# Patient Record
Sex: Male | Born: 1960 | Hispanic: No | Marital: Married | Smoking: Never smoker
Health system: Southern US, Community
[De-identification: ages and names within clinical notes are randomized; demographics above are authoritative.]

## PROBLEM LIST (undated history)

## (undated) DIAGNOSIS — S060XAA Concussion with loss of consciousness status unknown, initial encounter: Secondary | ICD-10-CM

## (undated) HISTORY — DX: Concussion with loss of consciousness status unknown, initial encounter: S06.0XAA

---

## 2015-10-26 LAB — HM COLONOSCOPY

## 2020-11-21 ENCOUNTER — Other Ambulatory Visit (HOSPITAL_BASED_OUTPATIENT_CLINIC_OR_DEPARTMENT_OTHER): Payer: Self-pay | Admitting: Family Medicine

## 2020-11-21 DIAGNOSIS — S060X0A Concussion without loss of consciousness, initial encounter: Secondary | ICD-10-CM

## 2020-11-29 ENCOUNTER — Ambulatory Visit (HOSPITAL_BASED_OUTPATIENT_CLINIC_OR_DEPARTMENT_OTHER): Payer: 59

## 2020-12-07 ENCOUNTER — Other Ambulatory Visit: Payer: Self-pay

## 2020-12-07 ENCOUNTER — Ambulatory Visit: Payer: 59 | Attending: Family Medicine

## 2020-12-07 DIAGNOSIS — R42 Dizziness and giddiness: Secondary | ICD-10-CM

## 2020-12-07 DIAGNOSIS — M542 Cervicalgia: Secondary | ICD-10-CM | POA: Insufficient documentation

## 2020-12-07 DIAGNOSIS — R2681 Unsteadiness on feet: Secondary | ICD-10-CM | POA: Diagnosis present
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
## 2020-12-12 ENCOUNTER — Other Ambulatory Visit: Payer: Self-pay

## 2020-12-12 ENCOUNTER — Ambulatory Visit (HOSPITAL_BASED_OUTPATIENT_CLINIC_OR_DEPARTMENT_OTHER)
Admission: RE | Admit: 2020-12-12 | Discharge: 2020-12-12 | Disposition: A | Discharge status: Home / Self Care | Payer: 59 | Source: Ambulatory Visit | Attending: Family Medicine | Admitting: Family Medicine

## 2020-12-12 DIAGNOSIS — S060X0D Concussion without loss of consciousness, subsequent encounter: Secondary | ICD-10-CM | POA: Diagnosis not present

## 2020-12-12 DIAGNOSIS — S060X0A Concussion without loss of consciousness, initial encounter: Secondary | ICD-10-CM

## 2020-12-14 ENCOUNTER — Other Ambulatory Visit: Payer: Self-pay

## 2020-12-14 ENCOUNTER — Ambulatory Visit: Payer: 59

## 2020-12-14 DIAGNOSIS — R42 Dizziness and giddiness: Secondary | ICD-10-CM

## 2020-12-14 DIAGNOSIS — R2681 Unsteadiness on feet: Secondary | ICD-10-CM

## 2020-12-14 DIAGNOSIS — M542 Cervicalgia: Secondary | ICD-10-CM | POA: Diagnosis not present

## 2020-12-14 NOTE — Patient Instructions (Signed)
Access Code: [email or web address] URL: [email or web address] Date: 12/14/2020 Prepared by: Jethro Bastos  Exercises Seated Cervical Sidebending Stretch - 1 x daily - 7 x weekly - 1 sets - 3 reps - 30 seconds hold Seated Levator Scapulae Stretch - 1 x daily - 7 x weekly - 1 sets - 3 reps - 30 seconds hold Seated Horizontal Smooth Pursuit - 1 x daily - 7 x weekly - 1 sets - 5 reps
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
## 2020-12-20 ENCOUNTER — Other Ambulatory Visit: Payer: Self-pay

## 2020-12-20 ENCOUNTER — Encounter: Payer: Self-pay | Admitting: Physical Therapy

## 2020-12-20 ENCOUNTER — Ambulatory Visit: Payer: 59 | Admitting: Physical Therapy

## 2020-12-20 DIAGNOSIS — R42 Dizziness and giddiness: Secondary | ICD-10-CM

## 2020-12-20 DIAGNOSIS — M542 Cervicalgia: Secondary | ICD-10-CM

## 2020-12-20 DIAGNOSIS — R2681 Unsteadiness on feet: Secondary | ICD-10-CM
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
## 2020-12-27 ENCOUNTER — Other Ambulatory Visit: Payer: Self-pay

## 2020-12-27 ENCOUNTER — Encounter: Payer: Self-pay | Admitting: Physical Therapy

## 2020-12-27 ENCOUNTER — Ambulatory Visit: Payer: 59 | Attending: Family Medicine | Admitting: Physical Therapy

## 2020-12-27 DIAGNOSIS — R42 Dizziness and giddiness: Secondary | ICD-10-CM | POA: Insufficient documentation

## 2020-12-27 DIAGNOSIS — M542 Cervicalgia: Secondary | ICD-10-CM

## 2020-12-27 DIAGNOSIS — R2681 Unsteadiness on feet: Secondary | ICD-10-CM | POA: Diagnosis present

## 2020-12-28 NOTE — Addendum Note (Signed)
Addended by: Hildred Laser on: 12/28/2020 08:24 AM   Modules accepted: Orders
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
## 2021-01-04 ENCOUNTER — Ambulatory Visit: Payer: 59

## 2021-01-11 ENCOUNTER — Other Ambulatory Visit: Payer: Self-pay

## 2021-01-11 ENCOUNTER — Ambulatory Visit: Payer: 59

## 2021-01-11 DIAGNOSIS — R42 Dizziness and giddiness: Secondary | ICD-10-CM

## 2021-01-11 DIAGNOSIS — M542 Cervicalgia: Secondary | ICD-10-CM

## 2021-01-11 DIAGNOSIS — R2681 Unsteadiness on feet: Secondary | ICD-10-CM
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
## 2021-01-15 ENCOUNTER — Ambulatory Visit: Payer: 59 | Admitting: Psychiatry

## 2021-01-16 ENCOUNTER — Other Ambulatory Visit: Payer: Self-pay

## 2021-01-16 ENCOUNTER — Ambulatory Visit: Payer: 59

## 2021-01-16 DIAGNOSIS — M542 Cervicalgia: Secondary | ICD-10-CM | POA: Diagnosis not present

## 2021-01-16 DIAGNOSIS — R42 Dizziness and giddiness: Secondary | ICD-10-CM
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
## 2021-01-22 ENCOUNTER — Encounter: Payer: Self-pay | Admitting: *Deleted

## 2021-01-22 ENCOUNTER — Other Ambulatory Visit: Payer: Self-pay | Admitting: *Deleted
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
## 2021-01-23 ENCOUNTER — Other Ambulatory Visit: Payer: Self-pay

## 2021-01-23 ENCOUNTER — Ambulatory Visit: Payer: 59 | Admitting: Psychiatry

## 2021-01-23 ENCOUNTER — Encounter: Payer: Self-pay | Admitting: Psychiatry

## 2021-01-23 VITALS — BP 142/92 | HR 71 | Ht 69.0 in | Wt 177.0 lb

## 2021-01-23 DIAGNOSIS — H538 Other visual disturbances: Secondary | ICD-10-CM | POA: Diagnosis not present

## 2021-01-23 DIAGNOSIS — F0781 Postconcussional syndrome: Secondary | ICD-10-CM | POA: Diagnosis not present

## 2021-01-23 DIAGNOSIS — R413 Other amnesia: Secondary | ICD-10-CM

## 2021-01-23 DIAGNOSIS — R200 Anesthesia of skin: Secondary | ICD-10-CM | POA: Diagnosis not present

## 2021-01-23 NOTE — Patient Instructions (Addendum)
MRI brain and cervical spine Speech therapy for cognitive rehab   Post Concussive Syndrome:  Post-concussion syndrome is a complex disorder in which various symptoms -- such as headaches and dizziness -- last for weeks and sometimes months after the injury that caused the concussion. Concussion is a mild traumatic brain injury, usually occurring after a blow to the head. Loss of consciousness isn't required for a diagnosis of concussion or post-concussion syndrome. In fact, the risk of post-concussion syndrome doesn't appear to be associated with the severity of the initial injury. In most people, post-concussion syndrome symptoms occur within the first seven to 10 days and go away within three months, though they can persist for a year or more. Post-concussion syndrome treatments are aimed at easing specific symptoms.  Post-concussion symptoms include: Headaches  Dizziness  Fatigue  Irritability  Anxiety  Insomnia  Loss of concentration and memory  Noise and light sensitivity  Headaches that occur after a concussion can vary and may feel like tension-type headaches or migraines. Most, however, are tension-type headaches, which may be associated with a neck injury that happened at the same time as the head injury. In some cases, people experience behavior or emotional changes after a mild traumatic brain injury. Family members may notice that the person has become more irritable, suspicious, argumentative or stubborn. When to see a doctor See a doctor if you experience a head injury severe enough to cause confusion or amnesia -- even if you never lost consciousness. If a concussion occurs while you're playing a sport, don't go back in the game. Seek medical attention so that you don't risk worsening your injury. Causes: Some experts believe post-concussion symptoms are caused by structural damage to the brain or disruption of neurotransmitter systems, resulting from the impact that caused  the concussion. Others believe post-concussion symptoms are related to psychological factors, especially since the most common symptoms -- headache, dizziness and sleep problems -- are similar to those often experienced by people diagnosed with depression, anxiety or post-traumatic stress disorder. In many cases, both physiological effects of brain trauma and emotional reactions to these effects play a role in the development of symptoms. Researchers haven't determined why some people who've had concussions develop persistent post-concussion symptoms while others do not. No proven correlation between the severity of the injury and the likelihood of developing persistent post-concussion symptoms exists.  Risk Factors: Risk factors for developing post-concussion syndrome include: Age. Studies have found increasing age to be a risk factor for post-concussion syndrome.  Sex. Women are more likely to be diagnosed with post-concussion syndrome, but this may be because women are generally more likely to seek medical care.  Trauma. Concussions resulting from car collisions, falls, assaults and sports injuries are commonly associated with post-concussion syndrome. Treatment: There is no specific treatment for post-concussion syndrome. Instead, your doctor will treat the individual symptoms you're experiencing. The types of symptoms and their frequency are unique to each person. Headaches Medications commonly used for migraines or tension headaches, including some antidepressants, appear to be effective when these types of headaches are associated with post-concussion syndrome. Examples include: Amitriptyline. This medication has been widely used for post-traumatic injuries, as well as for symptoms commonly associated with post-concussion syndrome, such as irritability, dizziness and depression. Topiramate. Commonly used to treat migraines, topiramate (Qudexy XR, Topamax, Trokendi XR) may be effective in  reducing headaches after head injury. Common side effects of topiramate include weight loss and cognitive problems.  Gabapentin. Gabapentin (Gralise, Neurontin) is frequently used to  treat a variety of types of pain and may be helpful in treating post-traumatic headaches. A common side effect of gabapentin is drowsiness.  Other agents used to treat migraines and tension-type headaches may also be helpful in some individuals. Keep in mind that the overuse of over-the-counter and prescription pain relievers may contribute to persistent post-concussion headaches. Memory and thinking problems No medications are currently recommended specifically for the treatment of cognitive problems after mild traumatic brain injury. Time may be the best therapy for post-concussion syndrome if you have cognitive problems, as most of them go away on their own in the weeks to months following the injury. Certain forms of cognitive therapy may be helpful, including focused rehabilitation that provides training in how to use a pocket calendar, Education officer, community or other techniques to work around memory deficits and attention skills. Relaxation therapy also may help. Dizziness Vestibular rehab (a specialized form of physical therapy can help this. Depression and anxiety The symptoms of post-concussion syndrome often improve after the affected person learns that there is a cause for his or her symptoms and that they will likely improve with time. Education about the disorder can ease a person's fears and help provide peace of mind. If you're experiencing new or increasing depression or anxiety after a concussion, some treatment options include: Psychotherapy. It may be helpful to discuss your concerns with a psychologist or psychiatrist who has experience in working with people with brain injury.  Medication. To combat anxiety or depression, antidepressants or anti-anxiety medications may be prescribed. Prevention: The  only known way to prevent post-concussion syndrome is to avoid the head injury in the first place. Avoiding head injuries Although you can't prepare for every potential situation, here are some tips for avoiding common causes of head injuries: Fasten your seat belt whenever you're traveling in a car, and be sure children are in age-appropriate safety seats. Children under 13 are safest riding in the back seat, especially if your car has air bags.  Use helmets whenever you or your children are bicycling, roller-skating, in-line skating, ice-skating, skiing, snowboarding, playing football, batting or running the bases in softball or baseball, skateboarding, or horseback riding. Wear a helmet when riding a motorcycle.  Take steps around the house to prevent falls, such as removing small area rugs, improving lighting and installing handrails.

## 2021-01-24 ENCOUNTER — Telehealth: Payer: Self-pay | Admitting: Psychiatry

## 2021-01-24 NOTE — Telephone Encounter (Signed)
Sent to Dr. Groat ph # [phone] 

## 2021-01-25 ENCOUNTER — Ambulatory Visit: Payer: 59 | Attending: Family Medicine | Admitting: Physical Therapy

## 2021-01-25 ENCOUNTER — Encounter: Payer: Self-pay | Admitting: Physical Therapy

## 2021-01-25 ENCOUNTER — Other Ambulatory Visit: Payer: Self-pay

## 2021-01-25 DIAGNOSIS — R41841 Cognitive communication deficit: Secondary | ICD-10-CM | POA: Insufficient documentation

## 2021-01-25 DIAGNOSIS — M542 Cervicalgia: Secondary | ICD-10-CM | POA: Insufficient documentation

## 2021-01-25 DIAGNOSIS — R2681 Unsteadiness on feet: Secondary | ICD-10-CM | POA: Diagnosis present

## 2021-01-25 DIAGNOSIS — R42 Dizziness and giddiness: Secondary | ICD-10-CM | POA: Diagnosis present
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
## 2021-02-01 ENCOUNTER — Ambulatory Visit: Payer: 59

## 2021-02-01 ENCOUNTER — Other Ambulatory Visit: Payer: Self-pay

## 2021-02-01 DIAGNOSIS — R42 Dizziness and giddiness: Secondary | ICD-10-CM

## 2021-02-01 DIAGNOSIS — R2681 Unsteadiness on feet: Secondary | ICD-10-CM

## 2021-02-01 DIAGNOSIS — M542 Cervicalgia: Secondary | ICD-10-CM | POA: Diagnosis not present
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
## 2021-02-01 NOTE — Patient Instructions (Signed)
Access Code: [email or web address] URL: [email or web address] Date: 02/01/2021 Prepared by: Jethro Bastos  Exercises Seated Cervical Sidebending Stretch - 1 x daily - 7 x weekly - 1 sets - 3 reps - 30 seconds hold Seated Levator Scapulae Stretch - 1 x daily - 7 x weekly - 1 sets - 3 reps - 30 seconds hold Seated Horizontal Smooth Pursuit - 1 x daily - 7 x weekly - 1 sets - 5 reps Seated Scapular Retraction - 1 x daily - 7 x weekly - 2 sets - 10 reps Narrow Base of Support Standing Balance on Foam Pad - 1 x daily - 7 x weekly - 1 sets - 3 reps - 30 seconds hold

## 2021-02-07 ENCOUNTER — Ambulatory Visit: Payer: 59 | Admitting: Physical Therapy

## 2021-02-11 ENCOUNTER — Other Ambulatory Visit: Payer: Self-pay

## 2021-02-11 ENCOUNTER — Ambulatory Visit: Payer: 59 | Admitting: Speech Pathology

## 2021-02-11 DIAGNOSIS — R41841 Cognitive communication deficit: Secondary | ICD-10-CM

## 2021-02-11 DIAGNOSIS — M542 Cervicalgia: Secondary | ICD-10-CM | POA: Diagnosis not present

## 2021-02-11 NOTE — Patient Instructions (Addendum)
   Dr. Earma Reading  3 ring binder for all of your PT and ST handouts   Tips to help facilitate better attention, concentration, focus   Do harder, longer tasks when you are most alert/awake  Break down larger tasks into small parts  Limit distractions of TV, radio, conversation, e mails/texts, appliance noise, etc - if a job is important, do it in a quiet room  Be aware of how you are functioning in high stimulation environments such as large stores, parties, restaurants - any place with lots of lights, noise, signs etc  Group conversations may be more difficult to process than one on one conversations  Give yourself extra time to process conversation, reading materials, directions or information from your healthcare providers  Organization is key - clutters of laundry, mail, paperwork, dirty dishes - all make it more difficult to concentrate  Before you start a task, have all the needed supplies, directions, recipes ready and organized. This way you don't have to go looking for something in the middle of a task and become distracted.   Be aware of fatigue - take rests or breaks when needed to re-group and re-focus
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
## 2021-02-22 ENCOUNTER — Ambulatory Visit: Payer: 59

## 2021-02-22 ENCOUNTER — Ambulatory Visit: Payer: 59 | Attending: Family Medicine

## 2021-02-22 ENCOUNTER — Other Ambulatory Visit: Payer: Self-pay

## 2021-02-22 DIAGNOSIS — R41841 Cognitive communication deficit: Secondary | ICD-10-CM | POA: Insufficient documentation

## 2021-02-22 DIAGNOSIS — R42 Dizziness and giddiness: Secondary | ICD-10-CM | POA: Diagnosis present

## 2021-02-22 DIAGNOSIS — R2681 Unsteadiness on feet: Secondary | ICD-10-CM | POA: Diagnosis present

## 2021-02-22 DIAGNOSIS — M542 Cervicalgia: Secondary | ICD-10-CM | POA: Diagnosis not present
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
## 2021-02-22 NOTE — Patient Instructions (Signed)
Access Code: [email or web address] URL: [email or web address] Date: 02/22/2021 Prepared by: Jethro Bastos  Exercises Seated Cervical Sidebending Stretch - 1 x daily - 7 x weekly - 1 sets - 3 reps - 30 seconds hold Seated Levator Scapulae Stretch - 1 x daily - 7 x weekly - 1 sets - 3 reps - 30 seconds hold Seated Horizontal Smooth Pursuit - 1 x daily - 7 x weekly - 1 sets - 5 reps Seated Scapular Retraction - 1 x daily - 7 x weekly - 2 sets - 10 reps Narrow Base of Support Standing Balance on Foam Pad - 1 x daily - 7 x weekly - 1 sets - 3 reps - 30 seconds hold Romberg Stance Eyes Closed on Foam Pad - 1 x daily - 7 x weekly - 1 sets - 3 reps - 30 seconds hold

## 2021-02-27 ENCOUNTER — Other Ambulatory Visit: Payer: Self-pay

## 2021-02-27 ENCOUNTER — Ambulatory Visit: Payer: 59

## 2021-02-27 DIAGNOSIS — M542 Cervicalgia: Secondary | ICD-10-CM

## 2021-02-27 DIAGNOSIS — R2681 Unsteadiness on feet: Secondary | ICD-10-CM

## 2021-02-27 DIAGNOSIS — R41841 Cognitive communication deficit: Secondary | ICD-10-CM

## 2021-02-27 DIAGNOSIS — R42 Dizziness and giddiness: Secondary | ICD-10-CM

## 2021-02-27 NOTE — Patient Instructions (Signed)
Read an article of your choice. Write down some notes about the article for our session of Friday.
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
## 2021-02-27 NOTE — Patient Instructions (Signed)
Access Code: [email or web address] URL: [email or web address] Date: 02/27/2021 Prepared by: Jethro Bastos  Exercises Seated Cervical Sidebending Stretch - 1 x daily - 7 x weekly - 1 sets - 3 reps - 30 seconds hold Seated Levator Scapulae Stretch - 1 x daily - 7 x weekly - 1 sets - 3 reps - 30 seconds hold Seated Horizontal Smooth Pursuit - 1 x daily - 7 x weekly - 1 sets - 5 reps Narrow Base of Support Standing Balance on Foam Pad - 1 x daily - 7 x weekly - 1 sets - 3 reps - 30 seconds hold Romberg Stance Eyes Closed on Foam Pad - 1 x daily - 7 x weekly - 1 sets - 3 reps - 30 seconds hold Scapular Retraction with Resistance - 1 x daily - 7 x weekly - 2 sets - 10 reps Sidelying Thoracic Rotation with Open Book - 1 x daily - 7 x weekly - 2 sets - 10 reps

## 2021-03-01 ENCOUNTER — Other Ambulatory Visit: Payer: Self-pay

## 2021-03-01 ENCOUNTER — Ambulatory Visit: Payer: 59

## 2021-03-01 DIAGNOSIS — R41841 Cognitive communication deficit: Secondary | ICD-10-CM

## 2021-03-01 DIAGNOSIS — M542 Cervicalgia: Secondary | ICD-10-CM | POA: Diagnosis not present

## 2021-03-01 NOTE — Patient Instructions (Addendum)
   Memory Compensation Strategies  Use "WARM" strategy. W= write it down A=  associate it R=  repeat it M=  make a mental picture  You can keep a Memory Notebook. Use a 3-ring notebook with sections for the following:  calendar, important names and phone numbers, medications, doctors' names/phone numbers, "to do list"/reminders, and a section to journal what you did each day  Use a calendar to write appointments down.  Write yourself a schedule for the day.  This can be placed on the calendar or in a separate section of the Memory Notebook.  Keeping a regular schedule can help memory.  Use medication organizer with sections for each day or morning/evening pills  You may need help loading it  Keep a basket, or pegboard by the door.   Place items that you need to take out with you in the basket or on the pegboard.  You may also want to include a message board for reminders.  Use sticky notes. Place sticky notes with reminders in a place where the task is performed.  For example:  "turn off the stove" placed by the stove, "lock the door" placed on the door at eye level, "take your medications" on the bathroom mirror or by the place where you normally take your medications  Use alarms, timers, and/or a reminder app. Use while cooking to remind yourself to check on food or as a reminder to take your medicine, or as a reminder to make a call, or as a reminder to perform another task, etc.    Energy Conservation Strategies  -Be mindful of how long you work on cognitive challenging tasks. Work for 10-15 minutes   -Take breaks throughout the day if you are getting mentally or physically fatigued  -Try to focus on one task at a time   -If you feel "sharper" in the morning or evening, work during the time you feel most alert/awake    Apps to try on our phone: TalkPath News and [employer] Therapy  Put sticky notes up in your house in areas where you may be forgetting to do something  (I.e., turn off lights)
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
## 2021-03-05 ENCOUNTER — Other Ambulatory Visit: Payer: Self-pay

## 2021-03-05 ENCOUNTER — Ambulatory Visit: Payer: 59

## 2021-03-05 DIAGNOSIS — R42 Dizziness and giddiness: Secondary | ICD-10-CM

## 2021-03-05 DIAGNOSIS — M542 Cervicalgia: Secondary | ICD-10-CM | POA: Diagnosis not present

## 2021-03-05 DIAGNOSIS — R2681 Unsteadiness on feet: Secondary | ICD-10-CM

## 2021-03-05 DIAGNOSIS — R41841 Cognitive communication deficit: Secondary | ICD-10-CM

## 2021-03-05 NOTE — Patient Instructions (Signed)
Access Code: [email or web address] URL: [email or web address] Date: 03/05/2021 Prepared by: Jethro Bastos  Exercises Seated Cervical Sidebending Stretch - 1 x daily - 7 x weekly - 1 sets - 3 reps - 30 seconds hold Seated Levator Scapulae Stretch - 1 x daily - 7 x weekly - 1 sets - 3 reps - 30 seconds hold Seated Horizontal Smooth Pursuit - 1 x daily - 7 x weekly - 1 sets - 5 reps Narrow Base of Support Standing Balance on Foam Pad - 1 x daily - 7 x weekly - 1 sets - 3 reps - 30 seconds hold Romberg Stance Eyes Closed on Foam Pad - 1 x daily - 7 x weekly - 1 sets - 3 reps - 30 seconds hold Scapular Retraction with Resistance - 1 x daily - 7 x weekly - 2 sets - 10 reps Sidelying Thoracic Rotation with Open Book - 1 x daily - 7 x weekly - 2 sets - 10 reps Median Nerve Flossing - Tray - 1 x daily - 7 x weekly - 2 sets - 5 reps
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
## 2021-03-08 ENCOUNTER — Other Ambulatory Visit: Payer: Self-pay

## 2021-03-08 ENCOUNTER — Ambulatory Visit: Payer: 59

## 2021-03-08 DIAGNOSIS — M542 Cervicalgia: Secondary | ICD-10-CM | POA: Diagnosis not present

## 2021-03-08 DIAGNOSIS — R41841 Cognitive communication deficit: Secondary | ICD-10-CM

## 2021-03-08 NOTE — Patient Instructions (Addendum)
**  Please complete the assigned speech therapy homework prior to your next session and return it to the speech therapist at your next visit.  Use sticky notes in the rooms of your house that you are forgetting to turn off the lights/flush the commode  Use daily journal/to-do lists every day to help you remember daily events and tasks that need to be completed
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
## 2021-03-13 ENCOUNTER — Other Ambulatory Visit: Payer: Self-pay

## 2021-03-13 ENCOUNTER — Ambulatory Visit: Payer: 59

## 2021-03-13 DIAGNOSIS — R42 Dizziness and giddiness: Secondary | ICD-10-CM

## 2021-03-13 DIAGNOSIS — M542 Cervicalgia: Secondary | ICD-10-CM

## 2021-03-13 DIAGNOSIS — R41841 Cognitive communication deficit: Secondary | ICD-10-CM
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
## 2021-03-13 NOTE — Patient Instructions (Signed)
It might be worth it to schedule a consult with your MD if you feel like the pain, mood, or lifestyle changes continue to persist   Continue to write your daily events/to do lists  I put stars on the homework if I noticed an error. It is a good cognitive tasks to identity and correct errors. Do this at your convenience.

## 2021-03-15 ENCOUNTER — Ambulatory Visit: Payer: 59

## 2021-03-20 ENCOUNTER — Ambulatory Visit: Payer: 59

## 2021-03-22 ENCOUNTER — Ambulatory Visit: Payer: 59

## 2021-03-28 ENCOUNTER — Ambulatory Visit: Payer: 59 | Attending: Family Medicine

## 2021-03-28 ENCOUNTER — Ambulatory Visit: Payer: 59

## 2021-03-28 ENCOUNTER — Other Ambulatory Visit: Payer: Self-pay

## 2021-03-28 DIAGNOSIS — R41841 Cognitive communication deficit: Secondary | ICD-10-CM | POA: Diagnosis present

## 2021-03-28 DIAGNOSIS — R42 Dizziness and giddiness: Secondary | ICD-10-CM

## 2021-03-28 DIAGNOSIS — R2681 Unsteadiness on feet: Secondary | ICD-10-CM | POA: Insufficient documentation

## 2021-03-28 DIAGNOSIS — M542 Cervicalgia: Secondary | ICD-10-CM | POA: Diagnosis present
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
## 2021-03-28 NOTE — Patient Instructions (Signed)
Access Code: [email or web address] URL: [email or web address] Date: 03/28/2021 Prepared by: Jethro Bastos  Exercises Seated Cervical Sidebending Stretch - 1 x daily - 7 x weekly - 1 sets - 3 reps - 30 seconds hold Seated Levator Scapulae Stretch - 1 x daily - 7 x weekly - 1 sets - 3 reps - 30 seconds hold Seated Horizontal Smooth Pursuit - 1 x daily - 7 x weekly - 1 sets - 5 reps Romberg Stance Eyes Closed on Foam Pad - 1 x daily - 7 x weekly - 1 sets - 3 reps - 30 seconds hold Scapular Retraction with Resistance - 1 x daily - 7 x weekly - 2 sets - 10 reps Sidelying Thoracic Rotation with Open Book - 1 x daily - 7 x weekly - 2 sets - 10 reps Median Nerve Flossing - Tray - 1 x daily - 7 x weekly - 2 sets - 5 reps
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
## 2021-04-01 ENCOUNTER — Encounter: Payer: 59 | Admitting: Speech Pathology

## 2021-04-04 ENCOUNTER — Ambulatory Visit: Payer: 59

## 2021-04-21 ENCOUNTER — Encounter: Payer: Self-pay | Admitting: Emergency Medicine

## 2021-04-21 ENCOUNTER — Other Ambulatory Visit: Payer: Self-pay

## 2021-04-21 ENCOUNTER — Emergency Department
Admission: EM | Admit: 2021-04-21 | Discharge: 2021-04-21 | Disposition: A | Discharge status: Home / Self Care | Payer: 59 | Attending: Emergency Medicine | Admitting: Emergency Medicine

## 2021-04-21 DIAGNOSIS — R040 Epistaxis: Secondary | ICD-10-CM | POA: Insufficient documentation

## 2021-04-21 NOTE — ED Triage Notes (Signed)
Pt via POV from home. Pt c/o recurrent nosebleeds for the past 2 weeks. Pt states it usually stops on its own but today it been going on for 20 mins non stop. Bleeding controlled at this time. Denies hx of HTN. Pt is A&OX4 and NAD
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
## 2021-04-22 ENCOUNTER — Telehealth: Payer: Self-pay | Admitting: Psychiatry

## 2021-04-22 NOTE — Telephone Encounter (Signed)
[facility] pending faxed notes  

## 2021-04-23 ENCOUNTER — Ambulatory Visit: Payer: Self-pay | Admitting: *Deleted

## 2021-04-23 NOTE — Telephone Encounter (Signed)
Pt asking for ENT referral. States "I do not need referral from PCP." Advised best resource, also gave referral physician number. Pt hung up.      Reason for Disposition  [1] Bleeding recurs 3 or more times in 24 hours AND [2] direct pressure applied correctly  Answer Assessment - Initial Assessment Questions 1. AMOUNT OF BLEEDING: "How bad is the bleeding?" "How much blood was lost?" "Has the bleeding stopped?"   - MILD: needed a couple tissues   - MODERATE: needed many tissues   - SEVERE: large blood clots, soaked many tissues, lasted more than 30 minutes      Clots, lots of tissues. LAsted 15-20 minutes 2. ONSET: "When did the nosebleed start?"      SUnday, seen in ED. 3. FREQUENCY: "How many nosebleeds have you had in the last 24 hours?"      3-4 times . 40 minutes yesterday 4. RECURRENT SYMPTOMS: "Have there been other recent nosebleeds?" If Yes, ask: "How long did it take you to stop the bleeding?" "What worked best?"      *No Answer* 5. CAUSE: "What do you think caused this nosebleed?"     *No Answer* 6. LOCAL FACTORS: "Do you have any cold symptoms?", "Have you been rubbing or picking at your nose?"     *No Answer* 7. SYSTEMIC FACTORS: "Do you have high blood pressure or any bleeding problems?"      8. BLOOD THINNERS: "Do you take any blood thinners?" (e.g., aspirin, clopidogrel / Plavix, coumadin, heparin). Notes: Other strong blood thinners include: Arixtra (fondaparinux), Eliquis (apixaban), Pradaxa (dabigatran), and Xarelto (rivaroxaban).      9. OTHER SYMPTOMS: "Do you have any other symptoms?" (e.g., lightheadedness)  Protocols used: Nosebleed-A-AH

## 2021-04-23 NOTE — Telephone Encounter (Signed)
MR Brain w/wo contrast & MR Cervical spine wo contrast Dr. Delena Bali Friday health plan auth: [id number] (exp. 04/22/21 to 07/21/21).  Patient is scheduled at [facility] for 05/01/21.

## 2021-04-23 NOTE — Telephone Encounter (Signed)
Patient r/s for 04/30/21.

## 2021-04-30 ENCOUNTER — Ambulatory Visit: Payer: 59

## 2021-04-30 DIAGNOSIS — R200 Anesthesia of skin: Secondary | ICD-10-CM

## 2021-04-30 MED ORDER — GADOBENATE DIMEGLUMINE 529 MG/ML IV SOLN
15.0000 mL | Freq: Once | INTRAVENOUS | Status: AC | PRN
  Administered 2021-04-30: 15 mL via INTRAVENOUS

## 2021-05-01 ENCOUNTER — Other Ambulatory Visit: Payer: 59

## 2021-07-08 ENCOUNTER — Telehealth: Payer: Self-pay | Admitting: Psychiatry

## 2021-07-08 NOTE — Telephone Encounter (Signed)
Pt would like a call from nurse to discuss scheduling early appt before September.  ? ?Pt did not want to schedule appt due to availability was in September. ?

## 2021-07-23 ENCOUNTER — Telehealth: Payer: Self-pay | Admitting: Psychiatry

## 2021-07-23 NOTE — Telephone Encounter (Signed)
Per my chart message from April  This would just be for one appointment. I would have to ask Dr. Billey Gosling if we could split the appointment into 15 minutes since this would be a change to her template. I will talk with her once she is back in the office on 07/22/21. ?Pt was advised this appt would not be scheduled until Dr. Billey Gosling approved since he is asking to split a 30 minute visit between him and his wife. Once Dr. Billey Gosling has reviewed and ok'd  we will go from there. ? ? ?  ?

## 2021-07-23 NOTE — Telephone Encounter (Signed)
I think it would be too much of a time crunch to fit them both in one appointment, but maybe we could try to find two appointments back to back for them If I have any availablility

## 2021-07-23 NOTE — Telephone Encounter (Signed)
Pt advised of Dr. Karn Cassis response and via my chart. Pt was advised to call the office and set appointments up.  ?

## 2021-07-23 NOTE — Telephone Encounter (Signed)
Pt calling regarding appt to be scheduled 08/13/21.  ?Pt was under impression this appt had already been scheduled at 2pm.  ?Pt would like a call back to discuss appt for him and his wife.  ? ?

## 2021-09-09 ENCOUNTER — Encounter: Payer: Self-pay | Admitting: Psychiatry

## 2021-09-09 ENCOUNTER — Ambulatory Visit: Payer: 59 | Admitting: Psychiatry
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
## 2022-03-31 ENCOUNTER — Ambulatory Visit: Payer: BLUE CROSS/BLUE SHIELD | Admitting: Family

## 2022-05-30 ENCOUNTER — Telehealth: Payer: Self-pay | Admitting: Family Medicine

## 2022-05-30 NOTE — Telephone Encounter (Signed)
Contacted pt to r/s new pt appt on 4/5 for him & his wife. Pt asked could they both be squeezed in sooner than the end of may due to multiple r/s for both pts? call back # [phone]

## 2022-05-30 NOTE — Telephone Encounter (Signed)
Any availability for 2 patients together?

## 2022-06-03 NOTE — Telephone Encounter (Signed)
lvmtcb

## 2022-06-26 ENCOUNTER — Ambulatory Visit: Payer: BLUE CROSS/BLUE SHIELD | Admitting: Family

## 2022-06-27 ENCOUNTER — Ambulatory Visit: Payer: BLUE CROSS/BLUE SHIELD | Admitting: Family

## 2022-08-23 ENCOUNTER — Ambulatory Visit
Admission: EM | Admit: 2022-08-23 | Discharge: 2022-08-23 | Disposition: A | Discharge status: Home / Self Care | Payer: 59 | Attending: Emergency Medicine | Admitting: Emergency Medicine

## 2022-08-23 ENCOUNTER — Encounter: Payer: Self-pay | Admitting: Emergency Medicine

## 2022-08-23 DIAGNOSIS — H00015 Hordeolum externum left lower eyelid: Secondary | ICD-10-CM

## 2022-08-23 MED ORDER — ERYTHROMYCIN 5 MG/GM OP OINT: TOPICAL_OINTMENT | OPHTHALMIC | 0 refills | Status: DC
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
## 2022-08-23 NOTE — Discharge Instructions (Addendum)
Use the antibiotic eye ointment as prescribed.    Follow-up with your primary care provider if your symptoms are not improving.

## 2022-10-09 ENCOUNTER — Ambulatory Visit: Payer: 59 | Admitting: Orthopaedic Surgery

## 2022-10-09 ENCOUNTER — Other Ambulatory Visit (INDEPENDENT_AMBULATORY_CARE_PROVIDER_SITE_OTHER): Payer: 59

## 2022-10-09 ENCOUNTER — Encounter: Payer: Self-pay | Admitting: Orthopaedic Surgery

## 2022-10-09 ENCOUNTER — Ambulatory Visit (HOSPITAL_BASED_OUTPATIENT_CLINIC_OR_DEPARTMENT_OTHER): Payer: 59 | Admitting: Physician Assistant

## 2022-10-09 DIAGNOSIS — M25512 Pain in left shoulder: Secondary | ICD-10-CM | POA: Diagnosis not present

## 2022-10-09 MED ORDER — METHYLPREDNISOLONE ACETATE 40 MG/ML IJ SUSP
40.0000 mg | INTRAMUSCULAR | Status: AC | PRN
Start: 2022-10-09 — End: 2022-10-09
  Administered 2022-10-09: 40 mg via INTRA_ARTICULAR

## 2022-10-09 MED ORDER — LIDOCAINE HCL 1 % IJ SOLN
3.0000 mL | INTRAMUSCULAR | Status: AC | PRN
Start: 2022-10-09 — End: 2022-10-09
  Administered 2022-10-09: 3 mL

## 2022-10-09 MED ORDER — BUPIVACAINE HCL 0.5 % IJ SOLN
3.0000 mL | INTRAMUSCULAR | Status: AC | PRN
Start: 2022-10-09 — End: 2022-10-09
  Administered 2022-10-09: 3 mL via INTRA_ARTICULAR
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
## 2022-11-11 ENCOUNTER — Encounter: Payer: Self-pay | Admitting: Orthopaedic Surgery

## 2022-11-11 ENCOUNTER — Ambulatory Visit (INDEPENDENT_AMBULATORY_CARE_PROVIDER_SITE_OTHER): Payer: 59 | Admitting: Orthopaedic Surgery

## 2022-11-11 DIAGNOSIS — M7532 Calcific tendinitis of left shoulder: Secondary | ICD-10-CM | POA: Diagnosis not present

## 2022-11-11 MED ORDER — DICLOFENAC SODIUM 75 MG PO TBEC: 75.0000 mg | DELAYED_RELEASE_TABLET | Freq: Two times a day (BID) | ORAL | 2 refills | Status: DC
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
## 2023-01-13 ENCOUNTER — Encounter: Payer: Self-pay | Admitting: Family Medicine

## 2023-01-13 ENCOUNTER — Ambulatory Visit (INDEPENDENT_AMBULATORY_CARE_PROVIDER_SITE_OTHER): Payer: 59 | Admitting: Family Medicine

## 2023-01-13 VITALS — BP 110/78 | HR 63 | Temp 97.7°F | Resp 16 | Ht 69.0 in | Wt 172.1 lb

## 2023-01-13 DIAGNOSIS — E559 Vitamin D deficiency, unspecified: Secondary | ICD-10-CM

## 2023-01-13 DIAGNOSIS — M6520 Calcific tendinitis, unspecified site: Secondary | ICD-10-CM

## 2023-01-13 DIAGNOSIS — Z1159 Encounter for screening for other viral diseases: Secondary | ICD-10-CM

## 2023-01-13 DIAGNOSIS — Z125 Encounter for screening for malignant neoplasm of prostate: Secondary | ICD-10-CM | POA: Diagnosis not present

## 2023-01-13 DIAGNOSIS — R7309 Other abnormal glucose: Secondary | ICD-10-CM | POA: Diagnosis not present

## 2023-01-13 DIAGNOSIS — Z13220 Encounter for screening for lipoid disorders: Secondary | ICD-10-CM

## 2023-01-13 DIAGNOSIS — E538 Deficiency of other specified B group vitamins: Secondary | ICD-10-CM | POA: Diagnosis not present

## 2023-01-13 DIAGNOSIS — Z1329 Encounter for screening for other suspected endocrine disorder: Secondary | ICD-10-CM

## 2023-01-13 DIAGNOSIS — N529 Male erectile dysfunction, unspecified: Secondary | ICD-10-CM

## 2023-01-13 DIAGNOSIS — Z114 Encounter for screening for human immunodeficiency virus [HIV]: Secondary | ICD-10-CM

## 2023-01-13 DIAGNOSIS — Z1211 Encounter for screening for malignant neoplasm of colon: Secondary | ICD-10-CM

## 2023-01-13 DIAGNOSIS — Z0000 Encounter for general adult medical examination without abnormal findings: Secondary | ICD-10-CM | POA: Diagnosis not present

## 2023-01-13 LAB — COMPREHENSIVE METABOLIC PANEL
ALT: 20 U/L (ref 0–53)
AST: 18 U/L (ref 0–37)
Albumin: 4.5 g/dL (ref 3.5–5.2)
Alkaline Phosphatase: 55 U/L (ref 39–117)
BUN: 13 mg/dL (ref 6–23)
CO2: 27 meq/L (ref 19–32)
Calcium: 9.3 mg/dL (ref 8.4–10.5)
Chloride: 103 meq/L (ref 96–112)
Creatinine, Ser: 0.95 mg/dL (ref 0.40–1.50)
GFR: 86.48 mL/min (ref >60.00)
Glucose, Bld: 99 mg/dL (ref 70–99)
Potassium: 4.3 meq/L (ref 3.5–5.1)
Sodium: 139 meq/L (ref 135–145)
Total Bilirubin: 1.3 mg/dL — ABNORMAL HIGH (ref 0.2–1.2)
Total Protein: 7 g/dL (ref 6.0–8.3)

## 2023-01-13 LAB — LIPID PANEL
Cholesterol: 209 mg/dL — ABNORMAL HIGH (ref 0–200)
HDL: 48.2 mg/dL (ref >39.00)
LDL Cholesterol: 130 mg/dL — ABNORMAL HIGH (ref 0–99)
NonHDL: 160.58
Total CHOL/HDL Ratio: 4
Triglycerides: 153 mg/dL — ABNORMAL HIGH (ref 0.0–149.0)
VLDL: 30.6 mg/dL (ref 0.0–40.0)

## 2023-01-13 LAB — CBC WITH DIFFERENTIAL/PLATELET
Basophils Absolute: 0.1 10*3/uL (ref 0.0–0.1)
Basophils Relative: 1.2 % (ref 0.0–3.0)
Eosinophils Absolute: 0.2 10*3/uL (ref 0.0–0.7)
Eosinophils Relative: 3.6 % (ref 0.0–5.0)
HCT: 45.7 % (ref 39.0–52.0)
Hemoglobin: 15.2 g/dL (ref 13.0–17.0)
Lymphocytes Relative: 27.5 % (ref 12.0–46.0)
Lymphs Abs: 1.4 10*3/uL (ref 0.7–4.0)
MCHC: 33.3 g/dL (ref 30.0–36.0)
MCV: 96 fL (ref 78.0–100.0)
Monocytes Absolute: 0.5 10*3/uL (ref 0.1–1.0)
Monocytes Relative: 9.1 % (ref 3.0–12.0)
Neutro Abs: 2.9 10*3/uL (ref 1.4–7.7)
Neutrophils Relative %: 58.6 % (ref 43.0–77.0)
Platelets: 241 10*3/uL (ref 150.0–400.0)
RBC: 4.76 Mil/uL (ref 4.22–5.81)
RDW: 13.3 % (ref 11.5–15.5)
WBC: 5 10*3/uL (ref 4.0–10.5)

## 2023-01-13 LAB — PSA: PSA: 0.57 ng/mL (ref 0.10–4.00)

## 2023-01-13 LAB — VITAMIN B12: Vitamin B-12: 594 pg/mL (ref 211–911)

## 2023-01-13 LAB — VITAMIN D 25 HYDROXY (VIT D DEFICIENCY, FRACTURES): VITD: 28.3 ng/mL — ABNORMAL LOW (ref 30.00–100.00)

## 2023-01-13 LAB — HEMOGLOBIN A1C: Hgb A1c MFr Bld: 5.5 % (ref 4.6–6.5)

## 2023-01-13 LAB — TSH: TSH: 1.03 u[IU]/mL (ref 0.35–5.50)

## 2023-01-13 NOTE — Patient Instructions (Addendum)
It was a pleasure meeting you today. Thank you for allowing me to take part in your health care.  Our goals for today as we discussed include:  We will get some labs today.  If they are abnormal or we need to do something about them, I will call you.  If they are normal, I will send you a message on MyChart (if it is active) or a letter in the mail.  If you don't hear from [place] in 2 weeks, please call the office at the number below.   Referral sent for colonoscopy Referral sent to urology  This is a list of the screening recommended for you and due dates:  Health Maintenance  Topic Date Due   HIV Screening  Never done   Hepatitis C Screening  Never done   DTaP/Tdap/Td vaccine (1 - Tdap) Never done   Colon Cancer Screening  Never done   Zoster (Shingles) Vaccine (1 of 2) Never done   COVID-19 Vaccine (1 - 2023-24 season) Never done   Flu Shot  06/22/2023*   HPV Vaccine  Aged Out  *Topic was postponed. The date shown is not the original due date.       If you have any questions or concerns, please do not hesitate to call the office at [phone].  I look forward to our next visit and until then take care and stay safe.  Regards,   Dana Allan, MD   [facility]

## 2023-01-13 NOTE — Progress Notes (Unsigned)
SUBJECTIVE:   Chief Complaint  Patient presents with   Establish Care   HPI Presents to clinic to establish care  Discussed the use of AI scribe software for clinical note transcription with the patient, who gave verbal consent to proceed.  History of Present Illness The patient, previously under the care of Dr. Chanetta Marshall, presented to establish care after relocating. The patient has no current medications and denies any personal history of hypertension, diabetes, or high cholesterol. The patient's father had a history of diabetes, and the patient's mother, currently 17 years old, is suspected to have diabetes as well. The patient's eldest sister had a birth defect and passed away from cancer related to kidney failure at the age of 21.  The patient has not had any surgeries but underwent a colonoscopy three years ago, which revealed a 10mm polyp. The patient's last blood work was done a year ago.  The patient reported a history of calcific tendonitis in the left shoulder, which was treated with an injection by an orthopedic specialist. The patient reported significant improvement following the injection. The patient also mentioned a change in lifestyle and diet, including the consumption of fermented beans, which he believes has improved his overall health and bowel movements.  The patient expressed concerns about sexual health, reporting difficulty with erections since the age of 36. The patient's spouse is also experiencing symptoms of vaginal dryness.     PERTINENT PMH / PSH: As above  OBJECTIVE:  BP 110/78   Pulse 63   Temp 97.7 F (36.5 C)   Resp 16   Ht 5\' 9"  (1.753 m)   Wt 172 lb 2 oz (78.1 kg)   SpO2 98%   BMI 25.42 kg/m    Physical Exam Vitals reviewed.  HENT:     Head: Normocephalic.     Right Ear: Tympanic membrane, ear canal and external ear normal.     Left Ear: Tympanic membrane, ear canal and external ear normal.     Nose: Nose normal.     Mouth/Throat:      Mouth: Mucous membranes are moist.  Eyes:     Conjunctiva/sclera: Conjunctivae normal.     Pupils: Pupils are equal, round, and reactive to light.  Neck:     Thyroid: No thyromegaly or thyroid tenderness.     Vascular: No carotid bruit.  Cardiovascular:     Rate and Rhythm: Normal rate and regular rhythm.     Pulses: Normal pulses.     Heart sounds: Normal heart sounds.  Pulmonary:     Effort: Pulmonary effort is normal.     Breath sounds: Normal breath sounds.  Abdominal:     General: Abdomen is flat. Bowel sounds are normal.     Palpations: Abdomen is soft.  Musculoskeletal:        General: Normal range of motion.     Cervical back: Normal range of motion and neck supple.     Right lower leg: No edema.     Left lower leg: No edema.  Lymphadenopathy:     Cervical: No cervical adenopathy.  Neurological:     Mental Status: He is alert.  Psychiatric:        Mood and Affect: Mood normal.        Behavior: Behavior normal.        Thought Content: Thought content normal.        Judgment: Judgment normal.        01/13/2023    9:38 AM  Depression screen PHQ 2/9  Decreased Interest 0  Down, Depressed, Hopeless 0  PHQ - 2 Score 0  Altered sleeping 0  Tired, decreased energy 0  Change in appetite 0  Feeling bad or failure about yourself  0  Trouble concentrating 0  Moving slowly or fidgety/restless 0  Suicidal thoughts 0  PHQ-9 Score 0  Difficult doing work/chores Not difficult at all      01/13/2023    9:38 AM  GAD 7 : Generalized Anxiety Score  Nervous, Anxious, on Edge 0  Control/stop worrying 0  Worry too much - different things 0  Trouble relaxing 0  Restless 0  Easily annoyed or irritable 0  Afraid - awful might happen 0  Total GAD 7 Score 0  Anxiety Difficulty Not difficult at all    ASSESSMENT/PLAN:  Annual physical exam Assessment & Plan: Check annual labs today. Declined vaccines Referral for repeat colonoscopy    Colon cancer  screening Assessment & Plan: Last colonoscopy 10/26/2015. Completed at [facility]. -One 10 mm polyp removed in transverse colon. -Recommended  3 year repeat colonoscopy for surveillance. -See media for report. -Refer to GI   Orders: -     Ambulatory referral to Gastroenterology  B12 deficiency -     Vitamin B12  Vitamin D deficiency -     VITAMIN D 25 Hydroxy (Vit-D Deficiency, Fractures) -     Vitamin D (Ergocalciferol); Take 1 capsule (50,000 Units total) by mouth every 7 (seven) days.  Dispense: 12 capsule; Refill: 1  Abnormal glucose -     Hemoglobin A1c  Thyroid disorder screening -     TSH  Need for hepatitis C screening test -     Hepatitis C antibody  Encounter for screening for HIV -     HIV Antibody (routine testing w rflx)  Lipid screening -     Comprehensive metabolic panel -     CBC with Differential/Platelet -     Lipid panel  Prostate cancer screening -     PSA  Erectile disorder Assessment & Plan: Difficulty with erections since age 62. Discussed options including medications, lifestyle changes, and referral to urology. Pending lab results to assess testosterone levels. -Order PSA and testosterone levels. -Consider referral to urology based on lab results and patient preference.  Orders: -     Ambulatory referral to Urology  Calcific tendinitis Assessment & Plan: History of severe shoulder pain, improved with injection and physical therapy. Currently much better. -No further intervention needed at this time.    PDMP reviewed  Return if symptoms worsen or fail to improve, for PCP.  Dana Allan, MD

## 2023-01-14 LAB — HEPATITIS C ANTIBODY: Hepatitis C Ab: NONREACTIVE

## 2023-01-14 LAB — HIV ANTIBODY (ROUTINE TESTING W REFLEX): HIV 1&2 Ab, 4th Generation: NONREACTIVE

## 2023-01-15 ENCOUNTER — Encounter: Payer: Self-pay | Admitting: Family Medicine

## 2023-01-15 DIAGNOSIS — Z1159 Encounter for screening for other viral diseases: Secondary | ICD-10-CM | POA: Insufficient documentation

## 2023-01-15 DIAGNOSIS — M6520 Calcific tendinitis, unspecified site: Secondary | ICD-10-CM | POA: Insufficient documentation

## 2023-01-15 DIAGNOSIS — R7309 Other abnormal glucose: Secondary | ICD-10-CM | POA: Insufficient documentation

## 2023-01-15 DIAGNOSIS — Z1211 Encounter for screening for malignant neoplasm of colon: Secondary | ICD-10-CM | POA: Insufficient documentation

## 2023-01-15 DIAGNOSIS — Z125 Encounter for screening for malignant neoplasm of prostate: Secondary | ICD-10-CM | POA: Insufficient documentation

## 2023-01-15 DIAGNOSIS — E559 Vitamin D deficiency, unspecified: Secondary | ICD-10-CM | POA: Insufficient documentation

## 2023-01-15 DIAGNOSIS — N529 Male erectile dysfunction, unspecified: Secondary | ICD-10-CM | POA: Insufficient documentation

## 2023-01-15 DIAGNOSIS — Z114 Encounter for screening for human immunodeficiency virus [HIV]: Secondary | ICD-10-CM | POA: Insufficient documentation

## 2023-01-15 DIAGNOSIS — E538 Deficiency of other specified B group vitamins: Secondary | ICD-10-CM | POA: Insufficient documentation

## 2023-01-15 DIAGNOSIS — Z1329 Encounter for screening for other suspected endocrine disorder: Secondary | ICD-10-CM | POA: Insufficient documentation

## 2023-01-15 DIAGNOSIS — Z0000 Encounter for general adult medical examination without abnormal findings: Secondary | ICD-10-CM | POA: Insufficient documentation

## 2023-01-15 DIAGNOSIS — Z13220 Encounter for screening for lipoid disorders: Secondary | ICD-10-CM | POA: Insufficient documentation

## 2023-01-15 MED ORDER — VITAMIN D (ERGOCALCIFEROL) 1.25 MG (50000 UNIT) PO CAPS: 50000.0000 [IU] | ORAL_CAPSULE | ORAL | 1 refills | Status: DC

## 2023-01-15 NOTE — Assessment & Plan Note (Signed)
Difficulty with erections since age 62. Discussed options including medications, lifestyle changes, and referral to urology. Pending lab results to assess testosterone levels. -Order PSA and testosterone levels. -Consider referral to urology based on lab results and patient preference.

## 2023-01-15 NOTE — Assessment & Plan Note (Signed)
Last colonoscopy 10/26/2015. Completed at [facility]. -One 10 mm polyp removed in transverse colon. -Recommended  3 year repeat colonoscopy for surveillance. -See media for report. -Refer to GI

## 2023-01-15 NOTE — Assessment & Plan Note (Signed)
Check annual labs today. Declined vaccines Referral for repeat colonoscopy

## 2023-01-15 NOTE — Assessment & Plan Note (Signed)
History of severe shoulder pain, improved with injection and physical therapy. Currently much better. -No further intervention needed at this time.

## 2023-01-16 ENCOUNTER — Telehealth: Payer: Self-pay

## 2023-01-16 NOTE — Telephone Encounter (Signed)
Patient states he would like to have a paper copy of his lab results.  Patient states he will be glad to come by and pick them up.  I let patient know that I printed lab results for patient and put them in the folder up front for him to pick up.

## 2023-01-20 ENCOUNTER — Encounter: Payer: Self-pay | Admitting: *Deleted

## 2023-01-21 ENCOUNTER — Telehealth: Payer: Self-pay

## 2023-01-21 NOTE — Telephone Encounter (Signed)
Patient states we may contact him via MyChart if we are unable to reach him by phone.

## 2023-01-21 NOTE — Telephone Encounter (Signed)
Patient states he has not heard from anyone regarding scheduling his colonoscopy.

## 2023-01-26 ENCOUNTER — Telehealth: Payer: Self-pay

## 2023-01-26 ENCOUNTER — Telehealth: Payer: Self-pay | Admitting: Family Medicine

## 2023-01-26 NOTE — Telephone Encounter (Signed)
Pt requesting call back to reschedule colonoscopy

## 2023-01-26 NOTE — Telephone Encounter (Signed)
Patient call and was checking on his referrals to Urology and colonoscopy.

## 2023-01-26 NOTE — Telephone Encounter (Signed)
Called and spoke to pt about the referrals. Gave him the number, and advised him to call the office to schedule his appointment.

## 2023-01-27 ENCOUNTER — Telehealth: Payer: Self-pay

## 2023-01-27 ENCOUNTER — Other Ambulatory Visit: Payer: Self-pay

## 2023-01-27 DIAGNOSIS — Z1211 Encounter for screening for malignant neoplasm of colon: Secondary | ICD-10-CM

## 2023-01-27 MED ORDER — NA SULFATE-K SULFATE-MG SULF 17.5-3.13-1.6 GM/177ML PO SOLN: 1.0000 | Freq: Once | ORAL | 0 refills | Status: AC

## 2023-01-27 NOTE — Telephone Encounter (Signed)
Gastroenterology Pre-Procedure Review  Request Date: 02/10/23 Requesting Physician: Dr. Servando Snare  PATIENT REVIEW QUESTIONS: The patient responded to the following health history questions as indicated:    1. Are you having any GI issues? no 2. Do you have a personal history of Polyps? yes (last colonoscopy over 8 years performed in [place]) 3. Do you have a family history of Colon Cancer or Polyps? no 4. Diabetes Mellitus? no 5. Joint replacements in the past 12 months?no 6. Major health problems in the past 3 months?no 7. Any artificial heart valves, MVP, or defibrillator?no    MEDICATIONS & ALLERGIES:    Patient reports the following regarding taking any anticoagulation/antiplatelet therapy:   Plavix, Coumadin, Eliquis, Xarelto, Lovenox, Pradaxa, Brilinta, or Effient? no Aspirin? no  Patient confirms/reports the following medications:  Current Outpatient Medications  Medication Sig Dispense Refill   Vitamin D, Ergocalciferol, (DRISDOL) 1.25 MG (50000 UNIT) CAPS capsule Take 1 capsule (50,000 Units total) by mouth every 7 (seven) days. 12 capsule 1   No current facility-administered medications for this visit.    Patient confirms/reports the following allergies:  No Known Allergies  No orders of the defined types were placed in this encounter.   AUTHORIZATION INFORMATION Primary Insurance: 1D#: Group #:  Secondary Insurance: 1D#: Group #:  SCHEDULE INFORMATION: Date: 02/10/23 Time: Location: [facility]

## 2023-02-13 ENCOUNTER — Encounter: Payer: Self-pay | Admitting: Gastroenterology

## 2023-02-13 ENCOUNTER — Telehealth: Payer: Self-pay

## 2023-02-13 NOTE — Telephone Encounter (Signed)
Patient called in to get the time of his procedure. I inform him that he will have to call the [facility] and I gave him the number.

## 2023-02-16 ENCOUNTER — Ambulatory Visit
Admission: RE | Admit: 2023-02-16 | Discharge: 2023-02-16 | Disposition: A | Discharge status: Home / Self Care | Payer: 59 | Attending: Gastroenterology | Admitting: Gastroenterology

## 2023-02-16 ENCOUNTER — Encounter: Payer: Self-pay | Admitting: Gastroenterology

## 2023-02-16 ENCOUNTER — Ambulatory Visit: Payer: 59 | Admitting: Anesthesiology

## 2023-02-16 ENCOUNTER — Other Ambulatory Visit: Payer: Self-pay

## 2023-02-16 ENCOUNTER — Encounter
Admission: RE | Disposition: A | Discharge status: Home / Self Care | Payer: Self-pay | Source: Home / Self Care | Attending: Gastroenterology

## 2023-02-16 DIAGNOSIS — K635 Polyp of colon: Secondary | ICD-10-CM | POA: Diagnosis not present

## 2023-02-16 DIAGNOSIS — D125 Benign neoplasm of sigmoid colon: Secondary | ICD-10-CM | POA: Insufficient documentation

## 2023-02-16 DIAGNOSIS — Z1211 Encounter for screening for malignant neoplasm of colon: Secondary | ICD-10-CM | POA: Insufficient documentation

## 2023-02-16 DIAGNOSIS — D122 Benign neoplasm of ascending colon: Secondary | ICD-10-CM | POA: Insufficient documentation

## 2023-02-16 DIAGNOSIS — Z860100 Personal history of colon polyps, unspecified: Secondary | ICD-10-CM

## 2023-02-16 HISTORY — PX: COLONOSCOPY WITH PROPOFOL: SHX5780

## 2023-02-16 SURGERY — COLONOSCOPY WITH PROPOFOL
Anesthesia: General

## 2023-02-16 MED ORDER — LIDOCAINE HCL (CARDIAC) PF 100 MG/5ML IV SOSY
PREFILLED_SYRINGE | INTRAVENOUS | Status: DC | PRN
  Administered 2023-02-16: 60 mg via INTRAVENOUS

## 2023-02-16 MED ORDER — PROPOFOL 10 MG/ML IV BOLUS
INTRAVENOUS | Status: AC
  Filled 2023-02-16: qty 20

## 2023-02-16 MED ORDER — LACTATED RINGERS IV SOLN: INTRAVENOUS | Status: DC | PRN

## 2023-02-16 MED ORDER — STERILE WATER FOR IRRIGATION IR SOLN
Status: DC | PRN
  Administered 2023-02-16: 1

## 2023-02-16 MED ORDER — LACTATED RINGERS IV SOLN: INTRAVENOUS | Status: DC

## 2023-02-16 MED ORDER — SODIUM CHLORIDE 0.9 % IV SOLN: INTRAVENOUS | Status: DC

## 2023-02-16 MED ORDER — LIDOCAINE HCL (PF) 2 % IJ SOLN
INTRAMUSCULAR | Status: AC
  Filled 2023-02-16: qty 5

## 2023-02-16 MED ORDER — PROPOFOL 10 MG/ML IV BOLUS
INTRAVENOUS | Status: DC | PRN
  Administered 2023-02-16: 60 mg via INTRAVENOUS
  Administered 2023-02-16: 90 mg via INTRAVENOUS
  Administered 2023-02-16: 50 mg via INTRAVENOUS

## 2023-02-16 SURGICAL SUPPLY — 6 items
GOWN CVR UNV OPN BCK APRN NK (MISCELLANEOUS) ×2 IMPLANT
KIT PRC NS LF DISP ENDO (KITS) ×1 IMPLANT
MANIFOLD NEPTUNE II (INSTRUMENTS) ×1 IMPLANT
SNARE COLD EXACTO (MISCELLANEOUS) IMPLANT
TRAP ETRAP POLY (MISCELLANEOUS) IMPLANT
WATER STERILE IRR 250ML POUR (IV SOLUTION) ×1 IMPLANT

## 2023-02-16 NOTE — Transfer of Care (Signed)
Immediate Anesthesia Transfer of Care Note  Patient: David Proctor  Procedure(s) Performed: COLONOSCOPY WITH PROPOFOL  Patient Location: PACU  Anesthesia Type: General  Level of Consciousness: awake, alert  and patient cooperative  Airway and Oxygen Therapy: Patient Spontanous Breathing and Patient connected to supplemental oxygen  Post-op Assessment: Post-op Vital signs reviewed, Patient's Cardiovascular Status Stable, Respiratory Function Stable, Patent Airway and No signs of Nausea or vomiting  Post-op Vital Signs: Reviewed and stable  Complications: No notable events documented.
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
## 2023-02-16 NOTE — Anesthesia Postprocedure Evaluation (Signed)
Anesthesia Post Note  Patient: David Proctor  Procedure(s) Performed: COLONOSCOPY WITH PROPOFOL  Patient location during evaluation: PACU Anesthesia Type: General Level of consciousness: awake and alert Pain management: pain level controlled Vital Signs Assessment: post-procedure vital signs reviewed and stable Respiratory status: spontaneous breathing, nonlabored ventilation, respiratory function stable and patient connected to nasal cannula oxygen Cardiovascular status: blood pressure returned to baseline and stable Postop Assessment: no apparent nausea or vomiting Anesthetic complications: no   No notable events documented.   Last Vitals:  Vitals:   02/16/23 1130 02/16/23 1135  BP: 100/82   Pulse: 65 63  Resp: 15 13  Temp:    SpO2: 94% 96%    Last Pain:  Vitals:   02/16/23 1130  TempSrc:   PainSc: 0-No pain                 Tashonna Descoteaux C Hosanna Betley

## 2023-02-16 NOTE — Op Note (Signed)
[facility] Gastroenterology Patient Name: David Proctor Procedure Date: 02/16/2023 10:46 AM MRN: [id number] Account #: [id number] Date of Birth: June 01, 1960 Admit Type: Outpatient Age: 62 Room: [facility] OR ROOM 01 Gender: Male Note Status: Finalized Instrument Name: [id number] Procedure:             Colonoscopy Indications:           High risk colon cancer surveillance: Personal history                         of colonic polyps Providers:             Midge Minium MD, MD Referring MD:          Dana Allan (Referring MD) Medicines:             Propofol per Anesthesia Complications:         No immediate complications. Procedure:             Pre-Anesthesia Assessment:                        - Prior to the procedure, a History and Physical was                         performed, and patient medications and allergies were                         reviewed. The patient's tolerance of previous                         anesthesia was also reviewed. The risks and benefits                         of the procedure and the sedation options and risks                         were discussed with the patient. All questions were                         answered, and informed consent was obtained. Prior                         Anticoagulants: The patient has taken no anticoagulant                         or antiplatelet agents. ASA Grade Assessment: II - A                         patient with mild systemic disease. After reviewing                         the risks and benefits, the patient was deemed in                         satisfactory condition to undergo the procedure.                        After obtaining informed consent, the colonoscope was  passed under direct vision. Throughout the procedure,                         the patient's blood pressure, pulse, and oxygen                         saturations were monitored continuously. The                          Colonoscope was introduced through the anus and                         advanced to the the cecum, identified by appendiceal                         orifice and ileocecal valve. The colonoscopy was                         performed without difficulty. The patient tolerated                         the procedure well. The quality of the bowel                         preparation was excellent. Findings:      The perianal and digital rectal examinations were normal.      Two sessile polyps were found in the ascending colon. The polyps were 4       to 5 mm in size. These polyps were removed with a cold snare. Resection       and retrieval were complete.      Two sessile polyps were found in the descending colon. The polyps were 3       to 4 mm in size. These polyps were removed with a cold snare. Resection       and retrieval were complete.      Two sessile polyps were found in the sigmoid colon. The polyps were 3 to       4 mm in size. These polyps were removed with a cold snare. Resection and       retrieval were complete. Impression:            - Two 4 to 5 mm polyps in the ascending colon, removed                         with a cold snare. Resected and retrieved.                        - Two 3 to 4 mm polyps in the descending colon,                         removed with a cold snare. Resected and retrieved.                        - Two 3 to 4 mm polyps in the sigmoid colon, removed                         with a cold snare. Resected and retrieved. Recommendation:        -  Discharge patient to home.                        - Resume previous diet.                        - Continue present medications.                        - Await pathology results.                        - Repeat colonoscopy in 5 years for surveillance. Procedure Code(s):     --- Professional ---                        [phone], Colonoscopy, flexible; with removal of                         tumor(s), polyp(s), or other  lesion(s) by snare                         technique Diagnosis Code(s):     --- Professional ---                        Z86.010, Personal history of colonic polyps                        D12.5, Benign neoplasm of sigmoid colon CPT copyright 2022 American Medical Association. All rights reserved. The codes documented in this report are preliminary and upon coder review may  be revised to meet current compliance requirements. Midge Minium MD, MD 02/16/2023 11:18:38 AM This report has been signed electronically. Number of Addenda: 0 Note Initiated On: 02/16/2023 10:46 AM Scope Withdrawal Time: 0 hours 10 minutes 35 seconds  Total Procedure Duration: 0 hours 16 minutes 56 seconds  Estimated Blood Loss:  Estimated blood loss: none.      [facility]

## 2023-02-17 ENCOUNTER — Encounter: Payer: 59 | Admitting: Nurse Practitioner

## 2023-02-17 ENCOUNTER — Encounter: Payer: Self-pay | Admitting: Gastroenterology

## 2023-02-17 LAB — SURGICAL PATHOLOGY

## 2023-02-18 ENCOUNTER — Encounter: Payer: Self-pay | Admitting: Gastroenterology

## 2023-02-25 ENCOUNTER — Ambulatory Visit (INDEPENDENT_AMBULATORY_CARE_PROVIDER_SITE_OTHER): Payer: 59 | Admitting: Urology

## 2023-02-25 VITALS — BP 142/84 | HR 69 | Ht 69.5 in | Wt 174.2 lb

## 2023-02-25 DIAGNOSIS — N529 Male erectile dysfunction, unspecified: Secondary | ICD-10-CM | POA: Diagnosis not present

## 2023-02-25 MED ORDER — SILDENAFIL CITRATE 20 MG PO TABS: ORAL_TABLET | ORAL | 11 refills | Status: AC
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
## 2023-04-01 ENCOUNTER — Telehealth: Payer: Self-pay

## 2023-04-01 NOTE — Telephone Encounter (Signed)
 Patient left a message on triage line today at 2:18 pm stating that OV with Dr Penne on 02/25/23 clain was denied for coverage with [facility] due to diagnoses was submitted for that visti as diagnostic but patient states this was a preventative visit and insurance needs this re submitted. Please advise patient.

## 2023-04-03 NOTE — Telephone Encounter (Signed)
 LMTRC

## 2023-04-08 ENCOUNTER — Telehealth: Payer: Self-pay | Admitting: Family Medicine

## 2023-04-08 NOTE — Telephone Encounter (Signed)
 The patient and his wife came in the office to refute a lab bill and the diagnosis of erectile dysfunction at his last office visit. They were explaining that the insurance would not cover the diagnostic diagnosis of erectile dysfunction. The bill was not sent from this office it was sent from [facility]. The couple tried to explain that the billing was because the provider from this office initially stated that he had ED. They also stated that the referral did not have to be made by this office instead they could have made the referral themselves. They added by them making the referral themselves it would have been a consult visit and not a diagnostic visit , therefore, the insurance would have covered it.  I stated I would pass this information on to the provider to see if there was any changes she would make to the visit diagnosis if there were any.

## 2023-04-08 NOTE — Telephone Encounter (Signed)
 Pt states he was referred to us  for preventive care by his PCP. His insurance has declined the claim bc we coded it as a dx code and not a preventive code.   Advised pt that he was referred for a problem (ED). We saw him for his problem, documented and treated him  appropriately.David Proctor   He is asking if the dx code can be changed to a preventive. I let him know I would send to the provider to make sure the documentation and coding is correct.  I let him know that we can not falsify a claim so his insurance will pay.   Will send to [facility].

## 2023-04-08 NOTE — Telephone Encounter (Signed)
 Copied from CRM [id number]. Topic: General - Other >> Apr 08, 2023  3:23 PM Jennings Mohr D wrote: Reason for CRM: Patient is requesting a copy of the office visit summary for DOS 01/13/23 and leave it with Sherian Dimitri the office Manager

## 2023-04-13 NOTE — Telephone Encounter (Signed)
Pt aware.   Pt is not understanding why we did not code this visit as preventive. Advised pt that we have coders review all charges before they go out the door and documentation and coding are correct for this visit.   Gave pt the number to customer service as I was not able to help him.

## 2023-06-09 ENCOUNTER — Telehealth: Payer: Self-pay | Admitting: Urology

## 2023-06-10 ENCOUNTER — Telehealth: Payer: Self-pay | Admitting: Urology

## 2023-06-10 NOTE — Telephone Encounter (Signed)
 PT walked in to office and is requesting his DOS 02/25/23 to be reviewed as he is stating he received a bill from his insurance saying he was billed as diagnostic procedure instead of preventive and his insurance told them we could change that. Pt is requesting bill to be reviewed.

## 2023-06-16 NOTE — Telephone Encounter (Signed)
 Pt aware that he was referred by pcp for ED.   AJB saw him for ED. She reviewed her note which  is correct.   Pt insist that he is in perfect health and did not have an issue/concern. He states the visit should be preventive. I advised him that we received a referral for ED. That dx is why we saw him and treated him. Advised pt that we are a specialist office and do not do preventative visits.   Apologized to patient for the confusion.

## 2023-06-23 ENCOUNTER — Telehealth: Payer: Self-pay | Admitting: Family Medicine

## 2023-06-23 NOTE — Telephone Encounter (Signed)
 Dr Clent Ridges is leaving the practice and your appointment on 01/15/24 needs to be rescheduled with another provider. Also you will need to schedule a transfer of care appointment with another provider to continue care at this office. Please call the office to schedule a Transfer of Care to either Dr Charlann Lange, MD, Bethanie Dicker, NP or Kara Dies, NP.   Thank you  E2C2, please reschedule physical, schedule a TOC visit for this patient. [facility]

## 2023-06-30 NOTE — Telephone Encounter (Signed)
 Patient went to Urology and they told him if Dr Clent Ridges could change the code to preventive. His insurance will cover.   Patient and wife would like to sit down with office manager. Please call him at [phone].

## 2023-07-01 NOTE — Telephone Encounter (Signed)
 Returned call to patient used two Identifiers to Identify. Patient requested to come and speak with me concerning a documentation amendment. I ask if 2 PM to day was sufficient? Patient agreed. Patient and patient spouse came into office same identifying questions ask also picture of patient in medical record. I ask to see Practice Administrator and wanted to explain why referral should have been placed as preventative versus diagnostic to urology. I let patient explain and patient requested documentation to be changed advised patient of protocol for Medical record amendments and process gave patient form to complete and protocol advised patient we would gladly fax request to medical records. I also advised patient of providers rights concerning medical record amendments.

## 2023-07-20 ENCOUNTER — Telehealth: Payer: Self-pay | Admitting: Family Medicine

## 2023-07-20 NOTE — Telephone Encounter (Signed)
 Paperwork for request of amendment of medical information signed and dropped off by patient today. It has been faxed to Medical Records. [facility]

## 2023-09-22 NOTE — Telephone Encounter (Signed)
 error

## 2024-01-15 ENCOUNTER — Encounter: Payer: 59 | Admitting: Family Medicine
